# Patient Record
Sex: Female | Born: 2005 | Race: Black or African American | Hispanic: No | Marital: Single | Smoking: Never smoker
Health system: Southern US, Community
[De-identification: ages and names within clinical notes are randomized; demographics above are authoritative.]

## PROBLEM LIST (undated history)

## (undated) DIAGNOSIS — L309 Dermatitis, unspecified: Secondary | ICD-10-CM

## (undated) HISTORY — DX: Dermatitis, unspecified: L30.9

---
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
## 2023-03-18 ENCOUNTER — Ambulatory Visit (INDEPENDENT_AMBULATORY_CARE_PROVIDER_SITE_OTHER): Payer: MEDICAID | Admitting: Adult Health

## 2023-03-18 ENCOUNTER — Encounter: Payer: Self-pay | Admitting: Adult Health

## 2023-03-18 ENCOUNTER — Other Ambulatory Visit (HOSPITAL_COMMUNITY)
Admission: RE | Admit: 2023-03-18 | Discharge: 2023-03-18 | Disposition: A | Discharge status: Home / Self Care | Payer: MEDICAID | Source: Ambulatory Visit | Attending: Adult Health | Admitting: Adult Health

## 2023-03-18 VITALS — BP 142/92 | HR 101 | Ht 70.0 in | Wt 223.0 lb

## 2023-03-18 DIAGNOSIS — N898 Other specified noninflammatory disorders of vagina: Secondary | ICD-10-CM | POA: Diagnosis present

## 2023-03-18 DIAGNOSIS — N9489 Other specified conditions associated with female genital organs and menstrual cycle: Secondary | ICD-10-CM | POA: Diagnosis present

## 2023-03-18 DIAGNOSIS — R030 Elevated blood-pressure reading, without diagnosis of hypertension: Secondary | ICD-10-CM | POA: Insufficient documentation

## 2023-03-18 NOTE — Progress Notes (Signed)
  Subjective:     Patient ID: Robin Powers, female   DOB: 06-12-2005, 17 y.o.   MRN: [id number]  HPI Robin Powers is a 17 year old black female,single, G0P0, in complaining of vaginal irritation and burning, has used new soap called Yoni.   PCP is Lianne Moris PA  Review of Systems +vaginal irritation and burning, has used  new soap called Yoni.  She sweats a lot, she is athlete  Reviewed past medical,surgical, social and family history. Reviewed medications and allergies.     Objective:   Physical Exam BP (!) 142/92 (BP Location: Right Arm, Patient Position: Sitting, Cuff Size: Normal)   Pulse (!) 101   Ht 5\' 10"  (1.778 m)   Wt 223 lb (101.2 kg)   BMI 32.00 kg/m     Skin warm and dry.Pelvic: external genitalia is normal in appearance no lesions, inner labia red, vagina: scant white discharge without odor,tender at introitus,urethra has no lesions or masses noted, cervix:smooth and bulbous, uterus: normal size, shape and contour, non tender, no masses felt, adnexa: no masses or tenderness noted. Bladder is non tender and no masses felt.  She preformed self swab.  Fall risk is low  Upstream - 03/18/23 0919       Pregnancy Intention Screening   Does the patient want to become pregnant in the next year? No    Does the patient's partner want to become pregnant in the next year? No    Would the patient like to discuss contraceptive options today? No      Contraception Wrap Up   Current Method Oral Contraceptive    End Method Oral Contraceptive    Contraception Counseling Provided Yes            Examination chaperoned by Malachy Mood LPN  Assessment:     1. Vaginal burning +burning after using new soap CV swab sent - Cervicovaginal ancillary only( [place])  2. Vaginal irritation Inner labia red, no lesions CV swab sent for GC/CHL,trich,BV and yeast No soaps for now,  Pat dry when using bathroom Can apply thin layer of aquaphor to inner labia to help soothe  -  Cervicovaginal ancillary only( [place])  3. Elevated BP without diagnosis of hypertension I expect it was up due to being uncomfortable, usually good     Plan:     Follow up prn

## 2023-03-19 LAB — CERVICOVAGINAL ANCILLARY ONLY
Bacterial Vaginitis (gardnerella): NEGATIVE
Candida Glabrata: NEGATIVE
Candida Vaginitis: POSITIVE — AB
Chlamydia: NEGATIVE
Comment: NEGATIVE
Comment: NEGATIVE
Comment: NEGATIVE
Comment: NEGATIVE
Comment: NEGATIVE
Comment: NORMAL
Neisseria Gonorrhea: NEGATIVE
Trichomonas: NEGATIVE

## 2023-03-22 ENCOUNTER — Other Ambulatory Visit: Payer: Self-pay | Admitting: Adult Health

## 2023-03-22 MED ORDER — FLUCONAZOLE 150 MG PO TABS: ORAL_TABLET | ORAL | 1 refills | Status: AC

## 2023-04-08 ENCOUNTER — Ambulatory Visit
Admission: EM | Admit: 2023-04-08 | Discharge: 2023-04-08 | Disposition: A | Discharge status: Home / Self Care | Payer: MEDICAID | Attending: Nurse Practitioner | Admitting: Nurse Practitioner

## 2023-04-08 ENCOUNTER — Other Ambulatory Visit: Payer: Self-pay

## 2023-04-08 ENCOUNTER — Encounter: Payer: Self-pay | Admitting: Emergency Medicine

## 2023-04-08 DIAGNOSIS — J0100 Acute maxillary sinusitis, unspecified: Secondary | ICD-10-CM | POA: Diagnosis not present

## 2023-04-08 MED ORDER — AMOXICILLIN-POT CLAVULANATE 875-125 MG PO TABS: 1.0000 | ORAL_TABLET | Freq: Two times a day (BID) | ORAL | 0 refills | Status: AC

## 2023-04-08 MED ORDER — FLUTICASONE PROPIONATE 50 MCG/ACT NA SUSP: 2.0000 | Freq: Every day | NASAL | 0 refills | Status: AC

## 2023-04-08 MED ORDER — PSEUDOEPHEDRINE HCL 30 MG PO TABS: 30.0000 mg | ORAL_TABLET | ORAL | 0 refills | Status: AC | PRN

## 2023-04-08 NOTE — ED Provider Notes (Signed)
[facility]    CSN: [id number] Arrival date & time: 04/08/23  1358      History   Chief Complaint Chief Complaint  Patient presents with   Dizziness    HPI Robin Powers is a 17 y.o. female.   The history is provided by the patient.   Patient presents with a more than 1 week history of bilateral eye pressure, facial pressure, dizziness, neck pain, headache, nasal congestion, and blurry vision that started this morning.  Patient denies fever, chills, ear pain, ear drainage, cough, chest pain, abdominal pain, nausea, vomiting, diarrhea, or rash.  Patient states that she has also been sneezing quite a bit.  She states that she has had increasing nasal congestion and nasal drainage.  She reports that she has been taking over-the-counter ibuprofen for her symptoms.  Patient denies any obvious known sick contacts. 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 No pertinent surgical history.  OB History     Gravida  0   Para  0   Term  0   Preterm  0   AB  0   Living  0      SAB  0   IAB  0   Ectopic  0   Multiple  0   Live Births               Home Medications    Prior to Admission medications   Medication Sig Start Date End Date Taking? Authorizing Provider  amoxicillin-clavulanate  (AUGMENTIN) 875-125 MG tablet Take 1 tablet by mouth every 12 (twelve) hours. 04/08/23  Yes Leath-Warren, Sadie Haber, NP  fluconazole (DIFLUCAN) 150 MG tablet Take 1 now and 1 in 3 days 03/22/23   Adline Potter, NP  fluticasone [facility]) 50 MCG/ACT nasal spray Place 2 sprays into both nostrils daily. 04/08/23  Yes Leath-Warren, Sadie Haber, NP  pseudoephedrine (SUDAFED) 30 MG tablet Take 1 tablet (30 mg total) by mouth every 4 (four) hours as needed for congestion. 04/08/23  Yes Leath-Warren, Sadie Haber, NP  UNABLE TO FIND Vaginal probiotic-BID   Yes [provider]  Levonorgestrel-Ethinyl Estradiol (AMETHIA) 0.15-0.03 &0.01 MG tablet Take 1 tablet by mouth daily. 08/31/22   Adline Potter, NP    Family History Family History  Problem Relation Age of Onset   Crohn's disease Mother    Hypertension Maternal Grandfather    Hypertension Paternal Grandmother    Cancer Other     Social History Social History   Tobacco Use   Smoking status: Never    Passive exposure: Never   Smokeless tobacco: Never  Vaping Use   Vaping status: Former  Substance Use Topics   Alcohol use: No   Drug use: No     Allergies   Patient has no known allergies.   Review of Systems Review of Systems Per  HPI  Physical Exam Triage Vital Signs ED Triage Vitals  Encounter Vitals Group     BP 04/08/23 1718 136/83     Systolic BP Percentile --      Diastolic BP Percentile --      Pulse Rate 04/08/23 1718 92     Resp 04/08/23 1718 17     Temp 04/08/23 1718 98.4 F (36.9 C)     Temp Source 04/08/23 1718 Oral     SpO2 04/08/23 1718 97 %     Weight --      Height --      Head Circumference --      Peak Flow --      Pain Score 04/08/23 1729 6     Pain Loc --      Pain Education --      Exclude from Growth Chart --    No data found.  Updated Vital Signs BP 136/83 (BP Location: Right Arm)   Pulse 92   Temp 98.4 F (36.9 C) (Oral)   Resp 17   SpO2 97%   Visual Acuity Right  Eye Distance: 20/20 Left Eye Distance: 20/20 Bilateral Distance: 20/15 (denies any corrective lenses or glasses at baseline.)  Right Eye Near:   Left Eye Near:    Bilateral Near:     Physical Exam Vitals and nursing note reviewed.  Constitutional:      General: She is not in acute distress.    Appearance: Normal appearance.  HENT:     Head: Normocephalic.     Right Ear: Tympanic membrane, ear canal and external ear normal.     Left Ear: Tympanic membrane, ear canal and external ear normal.     Nose: Congestion present.     Right Turbinates: Enlarged and swollen.     Left Turbinates: Enlarged and swollen.     Right Sinus: Maxillary sinus tenderness present. No frontal sinus tenderness.     Left Sinus: Maxillary sinus tenderness present. No frontal sinus tenderness.     Mouth/Throat:     Lips: Pink.     Mouth: Mucous membranes are moist.     Pharynx: Postnasal drip present. No pharyngeal swelling, oropharyngeal exudate, posterior oropharyngeal erythema or uvula swelling.  Eyes:     Extraocular Movements: Extraocular movements intact.     Conjunctiva/sclera: Conjunctivae normal.     Pupils: Pupils are equal, round, and reactive to light.  Cardiovascular:     Rate and Rhythm: Normal rate and regular rhythm.     Pulses: Normal pulses.     Heart sounds: Normal heart sounds.  Pulmonary:     Effort: Pulmonary effort is normal. No respiratory distress.     Breath sounds: Normal breath sounds. No stridor. No wheezing, rhonchi or rales.  Abdominal:     General: Bowel sounds are normal.     Palpations: Abdomen is soft.     Tenderness: There is no abdominal tenderness.  Musculoskeletal:     Cervical back: Normal range of motion.  Lymphadenopathy:     Cervical: No cervical adenopathy.  Skin:    General: Skin is warm and dry.  Neurological:     General: No focal deficit present.     Mental Status: She is alert and oriented to person, place, and time.     GCS: GCS eye subscore is 4.  GCS verbal subscore is 5. GCS motor subscore is 6.     Cranial Nerves: Cranial nerves 2-12 are intact.     Sensory:  Sensation is intact.     Motor: Motor function is intact.     Coordination: Coordination is intact.     Gait: Gait is intact.  Psychiatric:        Mood and Affect: Mood normal.        Behavior: Behavior normal.      UC Treatments / Results  Labs (all labs ordered are listed, but only abnormal results are displayed) Labs Reviewed - No data to display  EKG   Radiology No results found.  Procedures Procedures (including critical care time)  Medications Ordered in UC Medications - No data to display  Initial Impression / Assessment and Plan / UC Course  I have reviewed the triage vital signs and the nursing notes.  Pertinent labs & imaging results that were available during my care of the patient were reviewed by me and considered in my medical decision making (see chart for details).  On exam, lung sounds are clear throughout, room air sats at 97%.  Patient with moderate maxillary sinus tenderness, she is also experienced dizziness and increased nasal congestion.  Symptoms consistent with acute maxillary sinusitis.  Will provide treatment with Augmentin 875/125 mg tablets, fluticasone 50 micro nasal spray, and Sudafed 30 mg.  Supportive care recommendations were provided and discussed with the patient to include fluids, rest, over-the-counter analgesics, and normal saline nasal spray.  Discussed indications with the patient regarding when follow-up be necessary.  Patient was in agreement with this plan of care and verbalized understanding.  All questions were answered.  Patient stable for discharge.  Final Clinical Impressions(s) / UC Diagnoses   Final diagnoses:  Acute maxillary sinusitis, recurrence not specified     Discharge Instructions      Take medication as directed. Increase fluids and get plenty of rest. May take over-the-counter Ibuprofen or  Tylenol as needed for pain, fever, or general discomfort. Recommend normal saline nasal spray to help with nasal congestion throughout the day. For your cough, it may be helpful to use a humidifier at bedtime during sleep. If symptoms fail to improve with this treatment, you may follow-up in this clinic or with your primary care physician.     ED Prescriptions     Medication Sig Dispense Auth. Provider   amoxicillin-clavulanate (AUGMENTIN) 875-125 MG tablet Take 1 tablet by mouth every 12 (twelve) hours. 14 tablet Leath-Warren, Sadie Haber, NP   fluticasone (FLONASE) 50 MCG/ACT nasal spray Place 2 sprays into both nostrils daily. 16 g Leath-Warren, Sadie Haber, NP   pseudoephedrine (SUDAFED) 30 MG tablet Take 1 tablet (30 mg total) by mouth every 4 (four) hours as needed for congestion. 30 tablet Leath-Warren, Sadie Haber, NP      PDMP not reviewed this encounter.   Abran Cantor, NP 04/08/23 1756

## 2023-04-08 NOTE — ED Triage Notes (Addendum)
Pt reports bilateral eye pressure, facial pain, dizziness, neck pain, headache, leaning to one side with ambulation x 1 week. Blurry vision since this morning.  Denies any extremity weakness or numbness. Speech clear. Airway patent. Equal grips.

## 2023-04-08 NOTE — Discharge Instructions (Signed)
Take medication as directed. Increase fluids and get plenty of rest. May take over-the-counter Ibuprofen or Tylenol as needed for pain, fever, or general discomfort. Recommend normal saline nasal spray to help with nasal congestion throughout the day. For your cough, it may be helpful to use a humidifier at bedtime during sleep. If symptoms fail to improve with this treatment, you may follow-up in this clinic or with your primary care physician.

## 2023-05-26 NOTE — ED Triage Notes (Signed)
 Patient states she was seen at Dr yesterday - was diagnosed with stomach virus, reports waking this morning with left sided chest pain described as sharp, intermittent, made worse with deep breathing and moving, non radiating

## 2023-05-26 NOTE — ED Provider Notes (Signed)
 No chief complaint on file.      HPI HPI 18 year old female presents to the emergency department today for evaluation of chest pain.  Patient reports the pain is worse with palpation, deep inspiration and movement.  She reports some shortness of breath.  Patient reports that the past several days she been having a GI illness.  Exposed to influenza.  [facility]ent to [facility] had negative COVID, flu test.  Patient reports the chest pain developed today.  Patient denies any leg swelling.  She is on oral contraceptives.  No history of PE/DVT.  No cardiac history.  No family history of cardiac disease.  She is taken no medications for symptoms prior to arrival.  Denies any further nausea and vomiting today.    Patient History History reviewed. No pertinent past medical history. History reviewed. No pertinent surgical history. No family history on file. Social History   Tobacco Use  . Smoking status: Never  . Smokeless tobacco: Never  Substance Use Topics  . Alcohol use: Not Currently  . Drug use: Not Currently      Review of Systems Review of Systems Please refer to the HPI    Physical Exam ED Triage Vitals  Temp 05/26/23 1901 98.7 F (37.1 C)  Heart Rate 05/26/23 1858 109  Resp 05/26/23 1858 17  BP 05/26/23 1858 (!) 139/94  MAP (mmHg) 05/26/23 1858 106  SpO2 05/26/23 1858 100 %  O2 Device 05/26/23 1858 None (Room air)  O2 Flow Rate (L/min) --   Weight 05/26/23 1901 97.5 kg (215 lb)   Physical Exam Constitutional:      General: She is not in acute distress.    Appearance: Normal appearance. She is normal weight. She is not ill-appearing or toxic-appearing.  HENT:     Head: Normocephalic and atraumatic.     Mouth/Throat:     Mouth: Mucous membranes are moist.  Eyes:     Conjunctiva/sclera: Conjunctivae normal.  Cardiovascular:     Rate and Rhythm: Normal rate and regular rhythm.     Pulses: Normal pulses.     Heart sounds: Normal  heart sounds.  Pulmonary:     Effort: Pulmonary effort is normal.     Breath sounds: Normal breath sounds.  Chest:     Chest wall: Tenderness present.  Abdominal:     General: Abdomen is flat. Bowel sounds are normal.     Palpations: Abdomen is soft.     Tenderness: There is no abdominal tenderness.  Musculoskeletal:        General: Normal range of motion.     Cervical back: Normal range of motion and neck supple.  Skin:    General: Skin is warm and dry.     Capillary Refill: Capillary refill takes less than 2 seconds.  Neurological:     Mental Status: She is alert.  Psychiatric:        Mood and Affect: Mood normal.        Behavior: Behavior normal.        CHA2DS2-VASc Score: N/A  Glasgow Coma Scale Score: 15                   Procedures                       ED Course & MDM   Medical Decision Making 18 year old presents for chest pain.  Clinical Complexity Patient's presentation  is most consistent with acute presentation with potential threat to life or bodily function.   DDx: ACS, PE, dehydration, electrolyte derangement, cardiac arrhythmia, myocarditis, pericarditis, endocarditis, pleurisy, costochondritis, COVID-19, influenza  The patient was maintained on a cardiac monitor during ER stay.  I personally viewed and interpreted the cardiac monitoring which showed an underlying rhythm of: Sinus tachycardia heart rate of 102 bpm  EKG personally reviewed by myself which shows normal sinus rhythm with a heart rate of 98 bpm with a normal PR, QRS and QT interval.  No signs of acute ST elevation or depression.  No concerning arrhythmias.  Labs and imaging were performed which independently reviewed and interpreted shows no leukocytosis.  No significant electrolyte derangement.  Mild hypokalemia which was replaced with oral potassium in the ED.  Troponin was 3.  COVID and flu test were negative.  Pregnancy test was negative.  D-dimer was elevated.  Subsequent PE study was  performed which independently reviewed and interpreted shows no evidence of acute PE and no other acute intrathoracic abnormality.  Patient symptoms could be secondary to costochondritis, pleurisy, GERD.  I have low sufficient for cardiac etiology such as ACS of patient's pain.  Will treat symptomatically in the outpatient setting.  Pt is hemodynamically stable, in NAD, & able to ambulate in the ED. Evaluation does not show pathology that would require ongoing emergent intervention or inpatient treatment. I explained the diagnosis to the patient. Pain has been managed & Pt has no complaints prior to dc. Pt is comfortable with above plan and is stable for discharge at this time. All questions were answered prior to disposition. Strict return precautions for f/u to the ED were discussed. Encouraged follow up with PCP.   Problems Addressed: Chest pain, unspecified type: complicated acute illness or injury  Amount and/or Complexity of Data Reviewed Labs: ordered. Radiology: ordered. ECG/medicine tests: ordered.  Risk Prescription drug management.      ED Disposition:  Discharge Final diagnoses:  Chest pain, unspecified type    ED Prescriptions   None

## 2023-10-07 ENCOUNTER — Other Ambulatory Visit: Payer: Self-pay | Admitting: Adult Health

## 2023-10-21 ENCOUNTER — Emergency Department (HOSPITAL_COMMUNITY)
Admission: EM | Admit: 2023-10-21 | Discharge: 2023-10-21 | Discharge status: Against Medical Advice | Payer: MEDICAID | Source: Ambulatory Visit | Attending: Emergency Medicine | Admitting: Emergency Medicine

## 2023-10-21 ENCOUNTER — Other Ambulatory Visit: Payer: Self-pay

## 2023-10-21 ENCOUNTER — Encounter (HOSPITAL_COMMUNITY): Payer: Self-pay

## 2023-10-21 DIAGNOSIS — M7989 Other specified soft tissue disorders: Secondary | ICD-10-CM | POA: Diagnosis present

## 2023-10-21 DIAGNOSIS — Z5321 Procedure and treatment not carried out due to patient leaving prior to being seen by health care provider: Secondary | ICD-10-CM | POA: Insufficient documentation

## 2023-10-21 LAB — COMPREHENSIVE METABOLIC PANEL WITH GFR
ALT: 12 U/L (ref 0–44)
AST: 19 U/L (ref 15–41)
Albumin: 3.3 g/dL — ABNORMAL LOW (ref 3.5–5.0)
Alkaline Phosphatase: 45 U/L (ref 38–126)
Anion gap: 10 (ref 5–15)
BUN: 10 mg/dL (ref 6–20)
CO2: 25 mmol/L (ref 22–32)
Calcium: 8.9 mg/dL (ref 8.9–10.3)
Chloride: 102 mmol/L (ref 98–111)
Creatinine, Ser: 0.79 mg/dL (ref 0.44–1.00)
GFR, Estimated: >60 mL/min (ref >60)
Glucose, Bld: 81 mg/dL (ref 70–99)
Potassium: 3.7 mmol/L (ref 3.5–5.1)
Sodium: 137 mmol/L (ref 135–145)
Total Bilirubin: 0.5 mg/dL (ref 0.0–1.2)
Total Protein: 7 g/dL (ref 6.5–8.1)

## 2023-10-21 LAB — CBC WITH DIFFERENTIAL/PLATELET
Abs Immature Granulocytes: 0.02 K/uL (ref 0.00–0.07)
Basophils Absolute: 0 K/uL (ref 0.0–0.1)
Basophils Relative: 0 %
Eosinophils Absolute: 0.3 K/uL (ref 0.0–0.5)
Eosinophils Relative: 3 %
HCT: 38.9 % (ref 36.0–46.0)
Hemoglobin: 12.4 g/dL (ref 12.0–15.0)
Immature Granulocytes: 0 %
Lymphocytes Relative: 27 %
Lymphs Abs: 2 K/uL (ref 0.7–4.0)
MCH: 27 pg (ref 26.0–34.0)
MCHC: 31.9 g/dL (ref 30.0–36.0)
MCV: 84.7 fL (ref 80.0–100.0)
Monocytes Absolute: 0.6 K/uL (ref 0.1–1.0)
Monocytes Relative: 8 %
Neutro Abs: 4.7 K/uL (ref 1.7–7.7)
Neutrophils Relative %: 62 %
Platelets: 386 K/uL (ref 150–400)
RBC: 4.59 MIL/uL (ref 3.87–5.11)
RDW: 12.5 % (ref 11.5–15.5)
WBC: 7.6 K/uL (ref 4.0–10.5)
nRBC: 0 % (ref 0.0–0.2)

## 2023-10-21 LAB — BRAIN NATRIURETIC PEPTIDE: B Natriuretic Peptide: 9 pg/mL (ref 0.0–100.0)

## 2023-10-21 NOTE — ED Triage Notes (Signed)
 Pt arrived via POV c/o bilateral lower extremity swelling that has occurring over past month. Pt unsure if she has a blood clot, may have heart failure and Pt reports recently starting her job and is on her feet a lot. Pt also concerned it may be related to her birth control.  Pt reports pain with ambulation.

## 2023-10-22 ENCOUNTER — Ambulatory Visit
Admission: EM | Admit: 2023-10-22 | Discharge: 2023-10-22 | Disposition: A | Discharge status: Home / Self Care | Payer: MEDICAID

## 2023-10-22 DIAGNOSIS — R600 Localized edema: Secondary | ICD-10-CM | POA: Diagnosis not present

## 2023-10-22 NOTE — ED Triage Notes (Signed)
 Pt states that she has some bilateral feet swelling and pain. X1 month

## 2023-10-22 NOTE — ED Provider Notes (Signed)
 [facility]    CSN: [id number] Arrival date & time: 10/22/23  0951      History   Chief Complaint Chief Complaint  Patient presents with   feet swelling    HPI Robin Powers is a 18 y.o. female.   Patient presenting today with a month or so of bilateral lower leg and foot edema.  States she came home from college and started working at [employer] standing throughout the day and notes that about the time when the swelling began.  She denies any associated point tenderness, redness, unilateral worsening of the swelling, chest pain, shortness of breath, palpitations, fever, chills.  Sought care several weeks ago at a different urgent care and was given 3 days of Lasix which temporarily helped but swelling returned.  She went back yesterday and was told her symptoms are very concerning and to go to the emergency department.  She went to the emergency department and waited for 7 hours, had triage labs pulled but did not stay to see a provider given the long wait times.  Following up today for further evaluation.  She states urgent care yesterday did send in several days of Lasix but she has not yet picked this up.  Denies past medical history of cardiopulmonary issues, changes to medications or supplements.    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 No pertinent surgical history.  OB History     Gravida  0   Para  0   Term  0   Preterm  0   AB  0   Living  0      SAB  0   IAB  0   Ectopic  0   Multiple  0   Live Births               Home Medications    Prior to Admission medications   Medication Sig Start Date End Date Taking? Authorizing Provider  ASHLYNA 0.15-0.03 &0.01 MG tablet Take 1 tablet by mouth once daily 10/07/23  Yes Signa Nest A, NP  fluconazole  (DIFLUCAN ) 150 MG tablet Take 1 now and 1 in 3 days 03/22/23   Signa Nest LABOR, NP  furosemide (LASIX) 40 MG tablet Take 40 mg by mouth daily. 10/21/23  Yes [provider]  amoxicillin -clavulanate (AUGMENTIN ) 875-125 MG tablet Take 1 tablet by mouth every 12 (twelve) hours. 04/08/23   Leath-Warren, Etta PARAS, NP  fluticasone  (FLONASE ) 50 MCG/ACT nasal spray Place 2 sprays into both nostrils daily. 04/08/23   Leath-Warren, Etta PARAS, NP  pseudoephedrine  (SUDAFED) 30 MG tablet Take 1 tablet (30 mg total) by mouth every 4 (four) hours as needed for congestion. 04/08/23   Leath-Warren, Etta PARAS, NP  UNABLE TO FIND Vaginal probiotic-BID    [provider]    Family History Family History  Problem Relation  Age of Onset   Crohn's disease Mother    Hypertension Maternal Grandfather    Hypertension Paternal Grandmother    Cancer Other     Social History Social History   Tobacco Use   Smoking status: Never    Passive exposure: Never   Smokeless tobacco: Never  Vaping Use   Vaping status: Former  Substance Use Topics   Alcohol use: No   Drug use: No     Allergies   Orange oil   Review of Systems Review of Systems Per HPI  Physical Exam Triage Vital Signs ED Triage Vitals  Encounter Vitals Group     BP 10/22/23 1008 118/79     Girls Systolic BP Percentile --      Girls Diastolic BP Percentile --      Boys Systolic BP Percentile --      Boys Diastolic BP  Percentile --      Pulse Rate 10/22/23 1008 89     Resp 10/22/23 1008 20     Temp 10/22/23 1008 98.9 F (37.2 C)     Temp Source 10/22/23 1008 Oral     SpO2 10/22/23 1008 96 %     Weight 10/22/23 1005 228 lb (103.4 kg)     Height 10/22/23 1005 5' 10 (1.778 m)     Head Circumference --      Peak Flow --      Pain Score 10/22/23 1004 9     Pain Loc --      Pain Education --      Exclude from Growth Chart --    No data found.  Updated Vital Signs BP 118/79 (BP Location: Right Arm)   Pulse 89   Temp 98.9 F (37.2 C) (Oral)   Resp 20   Ht 5' 10 (1.778 m)   Wt 228 lb (103.4 kg)   LMP  (LMP Unknown)   SpO2 96%   BMI 32.71 kg/m   Visual Acuity Right Eye Distance:   Left Eye Distance:   Bilateral Distance:    Right Eye Near:   Left Eye Near:    Bilateral Near:     Physical Exam Vitals and nursing note reviewed.  Constitutional:      Appearance: Normal appearance. She is not ill-appearing.  HENT:     Head: Atraumatic.     Mouth/Throat:     Mouth: Mucous membranes are moist.  Eyes:     Extraocular Movements: Extraocular movements intact.     Conjunctiva/sclera: Conjunctivae normal.  Cardiovascular:     Rate and Rhythm: Normal rate and regular rhythm.     Heart sounds: Normal heart sounds.  Pulmonary:     Effort: Pulmonary effort is normal. No respiratory distress.     Breath sounds: Normal breath sounds.  Musculoskeletal:        General: Swelling present. Normal range of motion.     Cervical back: Normal range of motion and neck supple.     Comments: 1+ edema symmetric to bilateral lower legs and foot/ankles.  Nonpitting.  No point tenderness, palpable cords, negative squeeze test, negative Homans' sign bilaterally  Skin:    General: Skin is warm and dry.  Neurological:     Mental Status: She is alert and oriented to person, place, and time.     Motor: No weakness.     Gait: Gait normal.     Comments: Bilateral lower extremities neurovascularly intact   Psychiatric:        Mood  and Affect: Mood normal.        Thought Content: Thought content normal.        Judgment: Judgment normal.      UC Treatments / Results  Labs (all labs ordered are listed, but only abnormal results are displayed) Labs Reviewed - No data to display  EKG   Radiology No results found.  Procedures Procedures (including critical care time)  Medications Ordered in UC Medications - No data to display  Initial Impression / Assessment and Plan / UC Course  I have reviewed the triage vital signs and the nursing notes.  Pertinent labs & imaging results that were available during my care of the patient were reviewed by me and considered in my medical decision making (see chart for details).     Labs drawn by ER personally reviewed and benign from last night, vital signs within normal limits, exam very reassuring today with no red flag findings.  Very low suspicion for more emergent cause of ongoing edema, suspect can be improved with lifestyle changes to include maintaining a healthy weight, exercise, reducing sodium and processed foods, increasing water and electrolyte intake, compression stockings, leg elevation.  Follow-up with primary care for recheck if worsening or not resolving.  ED return precautions reviewed.  Final Clinical Impressions(s) / UC Diagnoses   Final diagnoses:  Peripheral edema     Discharge Instructions      As we discussed, reduce your salt intake particularly processed or frozen packaged foods, increase your water and electrolyte intake, exercise regularly and maintain a healthy weight, wear compression stockings that go up to the knee throughout the day until bedtime, elevate your legs at rest.  Follow-up for worsening or unresolving symptoms.    ED Prescriptions   None    PDMP not reviewed this encounter.   Stuart Vernell Norris, [place] 10/22/23 1100

## 2023-10-22 NOTE — Discharge Instructions (Signed)
 As we discussed, reduce your salt intake particularly processed or frozen packaged foods, increase your water and electrolyte intake, exercise regularly and maintain a healthy weight, wear compression stockings that go up to the knee throughout the day until bedtime, elevate your legs at rest.  Follow-up for worsening or unresolving symptoms.

## 2023-12-21 ENCOUNTER — Other Ambulatory Visit: Payer: Self-pay | Admitting: Adult Health

## 2023-12-31 ENCOUNTER — Encounter: Payer: Self-pay | Admitting: *Deleted

## 2024-02-04 ENCOUNTER — Encounter: Payer: Self-pay | Admitting: Emergency Medicine

## 2024-02-04 ENCOUNTER — Ambulatory Visit
Admission: EM | Admit: 2024-02-04 | Discharge: 2024-02-04 | Disposition: A | Discharge status: Home / Self Care | Payer: MEDICAID | Attending: Nurse Practitioner | Admitting: Nurse Practitioner

## 2024-02-04 ENCOUNTER — Other Ambulatory Visit: Payer: Self-pay

## 2024-02-04 DIAGNOSIS — N939 Abnormal uterine and vaginal bleeding, unspecified: Secondary | ICD-10-CM | POA: Insufficient documentation

## 2024-02-04 DIAGNOSIS — N898 Other specified noninflammatory disorders of vagina: Secondary | ICD-10-CM | POA: Diagnosis present

## 2024-02-04 LAB — POCT URINE PREGNANCY: Preg Test, Ur: NEGATIVE

## 2024-02-04 NOTE — ED Provider Notes (Signed)
 [facility]    CSN: [id number] Arrival date & time: 02/04/24  1414      History   Chief Complaint Chief Complaint  Patient presents with   Vaginal Discharge    HPI Robin Powers is a 18 y.o. female.   The history is provided by the patient.   Patient presents with a 1 week history of vaginal discharge.  Patient states that she has noticed intermittent clots over the past 24 hours.  She states that has since improved.  She denies fever, chills, abdominal pain, pelvic pain, urinary frequency, urgency, hematuria, flank pain, low back pain, vaginal itching, vaginal odor, or vaginal irritation.  Patient reports that she has had 1 female partner in the past 90 days, states her last period was 4 to 5 years ago.  States that she is taking oral birth control for a history of dysmenorrhea.  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 No pertinent surgical history.  OB History     Gravida  0   Para  0   Term  0   Preterm  0   AB  0   Living  0      SAB  0   IAB  0   Ectopic  0   Multiple  0   Live Births               Home Medications    Prior to Admission medications   Medication Sig Start Date End Date Taking? Authorizing Provider  fluconazole  (DIFLUCAN ) 150 MG tablet Take 1 now and  1 in 3 days 03/22/23   Signa Delon LABOR, NP  amoxicillin -clavulanate (AUGMENTIN ) 875-125 MG tablet Take 1 tablet by mouth every 12 (twelve) hours. 04/08/23   Leath-Warren, Etta PARAS, NP  ASHLYNA 0.15-0.03 &0.01 MG tablet Take 1 tablet by mouth once daily 12/21/23   Griffin, Jennifer A, NP  fluticasone  (FLONASE ) 50 MCG/ACT nasal spray Place 2 sprays into both nostrils daily. 04/08/23   Leath-Warren, Etta PARAS, NP  furosemide (LASIX) 40 MG tablet Take 40 mg by mouth daily. 10/21/23   [provider]  pseudoephedrine  (SUDAFED) 30 MG tablet Take 1 tablet (30 mg total) by mouth every 4 (four) hours as needed for congestion. 04/08/23   Leath-Warren, Etta PARAS, NP  UNABLE TO FIND Vaginal probiotic-BID    [provider]    Family History Family History  Problem Relation Age of Onset   Crohn's disease Mother    Hypertension Maternal Grandfather    Hypertension Paternal Grandmother    Cancer Other     Social History Social History   Tobacco Use   Smoking status: Never    Passive exposure: Never   Smokeless tobacco: Never  Vaping Use   Vaping status: Former  Substance Use Topics   Alcohol use: No   Drug use: No  Allergies   Orange oil   Review of Systems Review of Systems Per HPI  Physical Exam Triage Vital Signs ED Triage Vitals  Encounter Vitals Group     BP 02/04/24 1422 125/84     Girls Systolic BP Percentile --      Girls Diastolic BP Percentile --      Boys Systolic BP Percentile --      Boys Diastolic BP Percentile --      Pulse Rate 02/04/24 1422 94     Resp 02/04/24 1422 20     Temp 02/04/24 1422 97.8 F (36.6 C)     Temp Source 02/04/24 1422 Oral     SpO2 02/04/24 1422 97 %     Weight --      Height --      Head Circumference --      Peak Flow --      Pain Score 02/04/24 1429 2     Pain Loc --      Pain Education --      Exclude from Growth Chart --    No data found.  Updated Vital Signs BP 125/84 (BP Location: Right Arm)    Pulse 94   Temp 97.8 F (36.6 C) (Oral)   Resp 20   SpO2 97%   Visual Acuity Right Eye Distance:   Left Eye Distance:   Bilateral Distance:    Right Eye Near:   Left Eye Near:    Bilateral Near:     Physical Exam Vitals and nursing note reviewed.  Constitutional:      General: She is not in acute distress.    Appearance: Normal appearance.  HENT:     Head: Normocephalic.  Eyes:     Extraocular Movements: Extraocular movements intact.     Pupils: Pupils are equal, round, and reactive to light.  Cardiovascular:     Rate and Rhythm: Normal rate and regular rhythm.     Pulses: Normal pulses.     Heart sounds: Normal heart sounds.  Pulmonary:     Effort: Pulmonary effort is normal. No respiratory distress.     Breath sounds: Normal breath sounds. No stridor. No wheezing, rhonchi or rales.  Abdominal:     General: Bowel sounds are normal.     Palpations: Abdomen is soft.     Tenderness: There is no abdominal tenderness.  Genitourinary:    Comments: GU exam deferred, self swab performed  Musculoskeletal:     Cervical back: Normal range of motion.  Neurological:     General: No focal deficit present.     Mental Status: She is alert and oriented to person, place, and time.  Psychiatric:        Mood and Affect: Mood normal.        Behavior: Behavior normal.      UC Treatments / Results  Labs (all labs ordered are listed, but only abnormal results are displayed) Labs Reviewed - No data to display  EKG   Radiology No results found.  Procedures Procedures (including critical care time)  Medications Ordered in UC Medications - No data to display  Initial Impression / Assessment and Plan / UC Course  I have reviewed the triage vital signs and the nursing notes.  Pertinent labs & imaging results that were available during my care of the patient were reviewed by me and considered in my medical decision making (see chart for details).  Urine pregnancy test is  negative, cytology swab is pending.  Patient is requesting testing for all STIs and BV and yeast.  Patient advised to continue her current birth control.  Recommended to patient that she should follow-up with gynecology for reevaluation if the vaginal bleeding continues.  Patient was also given strict ER follow-up precautions regarding the vaginal bleeding.  Supportive care recommendations were provided and discussed with the patient to include refraining from sexual intercourse until her test results are received, notifying all partners if her test results are positive, and refraining from sexual intercourse for an additional 7 days if treatment is required.  Patient was in agreement with this plan of care and verbalizes understanding.  All questions were answered.  Patient stable for discharge.   Final Clinical Impressions(s) / UC Diagnoses   Final diagnoses:  None   Discharge Instructions   None    ED Prescriptions   None    PDMP not reviewed this encounter.   Gilmer Etta PARAS, NP 02/04/24 1547

## 2024-02-04 NOTE — ED Triage Notes (Addendum)
 Pt reports brown vaginal discharge with intermittent clots since last night. Pt denies abdominal pain reports headache's daily x1 week. Has taken ibuprofen . Denies vaginal bleeding at this time. Reports last intercourse on Monday. Last period 4-5 years ago. Taking oral BC.   Vaginal self swab obtained post triage.

## 2024-02-04 NOTE — Discharge Instructions (Addendum)
 The urine pregnancy test was negative.  Cytology swab is pending.  You will be contacted if the pending test results are abnormal.  You will also have access to the results via MyChart. Refrain from sexual intercourse until your test results are received. If your test results are positive, you will need to notify all partners.  If treatment is required, you will need to refrain from sexual intercourse for an additional 7 days after completing treatment. If you begin to experience heavier vaginal bleeding to include saturating more than 1 pad an hour, recommend follow-up in the emergency department for further evaluation. Please follow-up with your gynecologist for reevaluation if your test results are negative and you are continuing to experience symptoms. Follow-up as needed.

## 2024-02-07 LAB — CERVICOVAGINAL ANCILLARY ONLY
Bacterial Vaginitis (gardnerella): POSITIVE — AB
Candida Glabrata: NEGATIVE
Candida Vaginitis: POSITIVE — AB
Chlamydia: NEGATIVE
Comment: NEGATIVE
Comment: NEGATIVE
Comment: NEGATIVE
Comment: NEGATIVE
Comment: NEGATIVE
Comment: NORMAL
Neisseria Gonorrhea: NEGATIVE
Trichomonas: NEGATIVE

## 2024-02-08 ENCOUNTER — Ambulatory Visit (HOSPITAL_COMMUNITY): Payer: Self-pay

## 2024-02-08 MED ORDER — FLUCONAZOLE 150 MG PO TABS: 150.0000 mg | ORAL_TABLET | Freq: Once | ORAL | 0 refills | Status: AC

## 2024-02-08 MED ORDER — METRONIDAZOLE 500 MG PO TABS: 500.0000 mg | ORAL_TABLET | Freq: Two times a day (BID) | ORAL | 0 refills | Status: AC

## 2024-03-20 ENCOUNTER — Telehealth: Payer: Self-pay | Admitting: Adult Health

## 2024-03-20 ENCOUNTER — Other Ambulatory Visit: Payer: Self-pay | Admitting: Adult Health

## 2024-03-20 NOTE — Telephone Encounter (Signed)
 Pt called asking for a refill on Robin Powers. Pt advised it looks like [facility] refilled that today and to call pharmacy and ask when they will have it ready. Pt voiced understanding. JSY

## 2024-03-20 NOTE — Telephone Encounter (Signed)
 Patient is requesting refill on ASHLYNA. Please advise.

## 2024-04-25 ENCOUNTER — Ambulatory Visit: Payer: MEDICAID | Admitting: Adult Health

## 2024-04-28 ENCOUNTER — Ambulatory Visit: Payer: MEDICAID | Admitting: Adult Health

## 2024-05-02 ENCOUNTER — Encounter (HOSPITAL_COMMUNITY): Payer: Self-pay

## 2024-05-02 ENCOUNTER — Emergency Department (HOSPITAL_COMMUNITY): Payer: MEDICAID

## 2024-05-02 ENCOUNTER — Emergency Department (HOSPITAL_COMMUNITY)
Admission: EM | Admit: 2024-05-02 | Discharge: 2024-05-02 | Disposition: A | Discharge status: Home / Self Care | Payer: MEDICAID | Attending: Emergency Medicine | Admitting: Emergency Medicine

## 2024-05-02 ENCOUNTER — Other Ambulatory Visit: Payer: Self-pay

## 2024-05-02 DIAGNOSIS — R1024 Suprapubic pain: Secondary | ICD-10-CM | POA: Diagnosis not present

## 2024-05-02 DIAGNOSIS — R1032 Left lower quadrant pain: Secondary | ICD-10-CM | POA: Insufficient documentation

## 2024-05-02 DIAGNOSIS — N390 Urinary tract infection, site not specified: Secondary | ICD-10-CM

## 2024-05-02 DIAGNOSIS — R112 Nausea with vomiting, unspecified: Secondary | ICD-10-CM | POA: Diagnosis not present

## 2024-05-02 DIAGNOSIS — R109 Unspecified abdominal pain: Secondary | ICD-10-CM

## 2024-05-02 DIAGNOSIS — R1031 Right lower quadrant pain: Secondary | ICD-10-CM | POA: Insufficient documentation

## 2024-05-02 DIAGNOSIS — R1030 Lower abdominal pain, unspecified: Secondary | ICD-10-CM | POA: Diagnosis present

## 2024-05-02 LAB — URINALYSIS, ROUTINE W REFLEX MICROSCOPIC
Bilirubin Urine: NEGATIVE
Glucose, UA: NEGATIVE mg/dL
Ketones, ur: NEGATIVE mg/dL
Nitrite: NEGATIVE
Protein, ur: 30 mg/dL — AB
Specific Gravity, Urine: 1.018 (ref 1.005–1.030)
WBC, UA: >50 WBC/hpf (ref 0–5)
pH: 6 (ref 5.0–8.0)

## 2024-05-02 LAB — CBC WITH DIFFERENTIAL/PLATELET
Abs Immature Granulocytes: 0.02 K/uL (ref 0.00–0.07)
Basophils Absolute: 0.1 K/uL (ref 0.0–0.1)
Basophils Relative: 1 %
Eosinophils Absolute: 0.1 K/uL (ref 0.0–0.5)
Eosinophils Relative: 2 %
HCT: 43 % (ref 36.0–46.0)
Hemoglobin: 13.4 g/dL (ref 12.0–15.0)
Immature Granulocytes: 0 %
Lymphocytes Relative: 22 %
Lymphs Abs: 1.9 K/uL (ref 0.7–4.0)
MCH: 26.2 pg (ref 26.0–34.0)
MCHC: 31.2 g/dL (ref 30.0–36.0)
MCV: 84.1 fL (ref 80.0–100.0)
Monocytes Absolute: 0.7 K/uL (ref 0.1–1.0)
Monocytes Relative: 8 %
Neutro Abs: 5.8 K/uL (ref 1.7–7.7)
Neutrophils Relative %: 67 %
Platelets: 414 K/uL — ABNORMAL HIGH (ref 150–400)
RBC: 5.11 MIL/uL (ref 3.87–5.11)
RDW: 12 % (ref 11.5–15.5)
WBC: 8.7 K/uL (ref 4.0–10.5)
nRBC: 0 % (ref 0.0–0.2)

## 2024-05-02 LAB — LIPASE, BLOOD: Lipase: 20 U/L (ref 11–51)

## 2024-05-02 LAB — COMPREHENSIVE METABOLIC PANEL WITH GFR
ALT: 14 U/L (ref 0–44)
AST: 19 U/L (ref 15–41)
Albumin: 4.4 g/dL (ref 3.5–5.0)
Alkaline Phosphatase: 56 U/L (ref 38–126)
Anion gap: 15 (ref 5–15)
BUN: 9 mg/dL (ref 6–20)
CO2: 22 mmol/L (ref 22–32)
Calcium: 9.4 mg/dL (ref 8.9–10.3)
Chloride: 103 mmol/L (ref 98–111)
Creatinine, Ser: 0.9 mg/dL (ref 0.44–1.00)
GFR, Estimated: >60 mL/min
Glucose, Bld: 80 mg/dL (ref 70–99)
Potassium: 3.5 mmol/L (ref 3.5–5.1)
Sodium: 140 mmol/L (ref 135–145)
Total Bilirubin: 0.4 mg/dL (ref 0.0–1.2)
Total Protein: 8 g/dL (ref 6.5–8.1)

## 2024-05-02 LAB — HCG, SERUM, QUALITATIVE: Preg, Serum: NEGATIVE

## 2024-05-02 MED ORDER — SODIUM CHLORIDE 0.9 % IV SOLN
1.0000 g | Freq: Once | INTRAVENOUS | Status: AC
  Administered 2024-05-02: 1 g via INTRAVENOUS
  Filled 2024-05-02: qty 10

## 2024-05-02 MED ORDER — SODIUM CHLORIDE 0.9 % IV BOLUS
1000.0000 mL | Freq: Once | INTRAVENOUS | Status: AC
  Administered 2024-05-02: 1000 mL via INTRAVENOUS

## 2024-05-02 MED ORDER — DIPHENHYDRAMINE HCL 50 MG/ML IJ SOLN
25.0000 mg | Freq: Once | INTRAMUSCULAR | Status: AC
  Administered 2024-05-02: 25 mg via INTRAVENOUS
  Filled 2024-05-02: qty 1

## 2024-05-02 MED ORDER — METOCLOPRAMIDE HCL 10 MG PO TABS: 10.0000 mg | ORAL_TABLET | Freq: Four times a day (QID) | ORAL | 0 refills | Status: AC | PRN

## 2024-05-02 MED ORDER — DICYCLOMINE HCL 20 MG PO TABS: 20.0000 mg | ORAL_TABLET | Freq: Two times a day (BID) | ORAL | 0 refills | Status: AC

## 2024-05-02 MED ORDER — IOHEXOL 300 MG/ML  SOLN
100.0000 mL | Freq: Once | INTRAMUSCULAR | Status: AC | PRN
  Administered 2024-05-02: 100 mL via INTRAVENOUS

## 2024-05-02 MED ORDER — METOCLOPRAMIDE HCL 5 MG/ML IJ SOLN
10.0000 mg | Freq: Once | INTRAMUSCULAR | Status: AC
  Administered 2024-05-02: 10 mg via INTRAVENOUS
  Filled 2024-05-02: qty 2

## 2024-05-02 MED ORDER — CEPHALEXIN 500 MG PO CAPS: 500.0000 mg | ORAL_CAPSULE | Freq: Three times a day (TID) | ORAL | 0 refills | Status: AC

## 2024-05-02 MED ORDER — DICYCLOMINE HCL 10 MG/ML IM SOLN
20.0000 mg | Freq: Once | INTRAMUSCULAR | Status: AC
  Administered 2024-05-02: 20 mg via INTRAMUSCULAR
  Filled 2024-05-02: qty 2

## 2024-05-02 NOTE — Discharge Instructions (Signed)
 Please take all antibiotics as directed.  Follow-up closely with your primary care doctor on an outpatient basis.  Return to emergency department immediately for any new or worsening symptoms.

## 2024-05-02 NOTE — ED Triage Notes (Signed)
 Pt arrived via POV c/o abdominal pain that began Friday evening after eating fast food. Pt reports N/V began shortly after and reports going to urgent care w/o relief. Pt reports having 2 negative pregnancy results as well.

## 2024-05-02 NOTE — ED Provider Notes (Signed)
 " [place] EMERGENCY DEPARTMENT AT [facility] Provider Note   CSN: [id number] Arrival date & time: 05/02/24  1603     Patient presents with: Abdominal Pain   Robin Powers is a 19 y.o. female.   Patient is a 19 year old female who presents to Emergency Department with chief complaint of nausea, vomiting, lower abdominal pain which has been ongoing for approximate the past 5 days.  She now notes that she is having worsening pain in her back.  She was recently evaluated in urgent care and put on antibiotics for urinary tract infection as well as symptomatic treatment for her nausea and vomiting.  Patient notes that she has been compliant with all medications with worsening symptoms.  She denies any associated chest pain or shortness of breath.  There is no associated diarrhea.  She notes that her symptoms began after eating a grilled chicken sandwich and initially she thought this was food poisoning.   Abdominal Pain      Prior to Admission medications  Medication Sig Start Date End Date Taking? Authorizing Provider  fluconazole  (DIFLUCAN ) 150 MG tablet Take 1 now and 1 in 3 days 03/22/23   Signa Delon LABOR, NP  amoxicillin -clavulanate (AUGMENTIN ) 875-125 MG tablet Take 1 tablet by mouth every 12 (twelve) hours. 04/08/23   Leath-Warren, Etta PARAS, NP  ASHLYNA 0.15-0.03 &0.01 MG tablet Take 1 tablet by mouth once daily 03/20/24   Signa Delon A, NP  fluticasone  (FLONASE ) 50 MCG/ACT nasal spray Place 2 sprays into both nostrils daily. 04/08/23   Leath-Warren, Etta PARAS, NP  furosemide (LASIX) 40 MG tablet Take 40 mg by mouth daily. 10/21/23   [provider]  pseudoephedrine  (SUDAFED) 30 MG tablet Take 1 tablet (30 mg total) by mouth every 4 (four) hours as needed for congestion. 04/08/23   Leath-Warren, Etta PARAS, NP  UNABLE TO FIND Vaginal probiotic-BID    [provider]    Allergies: Orange oil    Review of Systems  Gastrointestinal:  Positive  for abdominal pain.  All other systems reviewed and are negative.   Updated Vital Signs BP (!) 141/95 (BP Location: Right Arm)   Pulse 79   Temp 98.6 F (37 C) (Oral)   Resp 20   Ht 5' 10 (1.778 m)   Wt 103.4 kg   LMP 03/29/2024 (Approximate)   SpO2 100%   BMI 32.71 kg/m   Physical Exam Vitals and nursing note reviewed.  Constitutional:      General: She is not in acute distress.    Appearance: Normal appearance. She is not ill-appearing.  HENT:     Head: Normocephalic and atraumatic.     Nose: Nose normal.     Mouth/Throat:     Mouth: Mucous membranes are moist.  Eyes:     Extraocular Movements: Extraocular movements intact.     Conjunctiva/sclera: Conjunctivae normal.     Pupils: Pupils are equal, round, and reactive to light.  Cardiovascular:     Rate and Rhythm: Normal rate and regular rhythm.     Pulses: Normal pulses.     Heart sounds: Normal heart sounds. No murmur heard.    No gallop.  Pulmonary:     Effort: Pulmonary effort is normal. No respiratory distress.     Breath sounds: Normal breath sounds. No stridor. No wheezing, rhonchi or rales.  Abdominal:     General: Abdomen is flat. Bowel sounds are normal. There is no distension.     Palpations: Abdomen is soft.  Tenderness: There is abdominal tenderness in the right lower quadrant, suprapubic area and left lower quadrant.  Musculoskeletal:        General: Normal range of motion.     Cervical back: Normal range of motion and neck supple.  Skin:    General: Skin is warm and dry.  Neurological:     General: No focal deficit present.     Mental Status: She is alert and oriented to person, place, and time. Mental status is at baseline.     Cranial Nerves: No cranial nerve deficit.     Motor: No weakness.  Psychiatric:        Mood and Affect: Mood normal.        Behavior: Behavior normal.        Thought Content: Thought content normal.        Judgment: Judgment normal.     (all labs ordered are  listed, but only abnormal results are displayed) Labs Reviewed  COMPREHENSIVE METABOLIC PANEL WITH GFR  LIPASE, BLOOD  CBC WITH DIFFERENTIAL/PLATELET  URINALYSIS, ROUTINE W REFLEX MICROSCOPIC  HCG, SERUM, QUALITATIVE    EKG: None  Radiology: No results found.   Procedures   Medications Ordered in the ED  sodium chloride  0.9 % bolus 1,000 mL (has no administration in time range)  metoCLOPramide  (REGLAN ) injection 10 mg (10 mg Intravenous Given 05/02/24 1654)  diphenhydrAMINE  (BENADRYL ) injection 25 mg (25 mg Intravenous Given 05/02/24 1654)  dicyclomine  (BENTYL ) injection 20 mg (20 mg Intramuscular Given 05/02/24 1653)                                    Medical Decision Making Amount and/or Complexity of Data Reviewed Labs: ordered. Radiology: ordered.  Risk Prescription drug management.   This patient presents to the ED for concern of abdominal pain, nausea, vomiting differential diagnosis includes acute appendicitis, cholecystitis, bowel obstruction, diverticulitis, ovarian torsion or cyst, PID, tubo-ovarian abscess, pyelonephritis, kidney stone, pancreatitis, mesenteric ischemia, gastroenteritis, colitis    Additional history obtained:  Additional history obtained from none External records from outside source obtained and reviewed including none   Lab Tests:  I Ordered, and personally interpreted labs.  The pertinent results include: No leukocytosis, no anemia, normal kidney function liver function, unremarkable electrolytes, urinalysis with large leukocytes and bacteria   Imaging Studies ordered:  I ordered imaging studies including CT scan abdomen and pelvis I independently visualized and interpreted imaging which showed no acute intra-abdominal surgical process I agree with the radiologist interpretation   Medicines ordered and prescription drug management:  I ordered medication including Rocephin , IV fluids, Reglan , Benadryl , Bentyl  for nausea,  vomiting, abdominal pain, urinary tract infection Reevaluation of the patient after these medicines showed that the patient improved I have reviewed the patients home medicines and have made adjustments as needed   Problem List / ED Course:  Patient is doing very well at this time and is stable for discharge home.  Symptoms have greatly improved with treatment in the emergency department.  She was given a dose of Rocephin  in the emergency department for her apparent urinary tract infection.  Urine culture has been sent as well.  Patient has no indication for pyelonephritis at this point.  Vital signs are stable with no indication for sepsis.  CT scan of the abdomen and pelvis demonstrated no acute surgical process.  Blood work is otherwise unremarkable.  Close follow-up with primary care doctor  was discussed as well as strict turn precautions for any new or worsening symptoms.  Patient voiced understanding to the plan and had no additional questions.  She is tolerating p.o. intake at this time without difficulty.   Social Determinants of Health:  None        Final diagnoses:  None    ED Discharge Orders     None          Daralene Lonni JONETTA DEVONNA 05/02/24 2027    Dean Clarity, MD 05/02/24 2254  "

## 2024-05-03 ENCOUNTER — Ambulatory Visit: Payer: MEDICAID | Admitting: Adult Health

## 2024-05-05 LAB — URINE CULTURE: Culture: >=100000 — AB

## 2024-05-06 ENCOUNTER — Telehealth (HOSPITAL_BASED_OUTPATIENT_CLINIC_OR_DEPARTMENT_OTHER): Payer: Self-pay | Admitting: *Deleted

## 2024-05-06 NOTE — Telephone Encounter (Signed)
 Post ED Visit - Positive Culture Follow-up: Unsuccessful Patient Follow-up  Culture assessed and recommendations reviewed by:  [x]  Dorn Poot,  Pharm.D. []  Venetia Gully, Pharm.D., BCPS AQ-ID []  Garrel Crews, Pharm.D., BCPS []  Almarie Lunger, Pharm.D., BCPS []  [employer], [address].D., BCPS, AAHIVP []  Rosaline Bihari, Pharm.D., BCPS, AAHIVP []  Massie Rigg, PharmD []  Jodie Rower, PharmD, BCPS  Positive urine culture  []  Patient discharged without antimicrobial prescription and treatment is now indicated [x]  Organism is resistant to prescribed ED discharge antimicrobial []  Patient with positive blood cultures  Plan: D/C Keflex  Give Macrobid 100mg  BID x 5 days per [name], [place]  Unable to contact patient , letter will be sent to address on file  Robin Powers 05/06/2024, 4:34 PM

## 2024-05-06 NOTE — Progress Notes (Signed)
 ED Antimicrobial Stewardship Positive Culture Follow Up   DAVION MEARA is an 19 y.o. female who presented to [facility] with a chief complaint of  Chief Complaint  Patient presents with   Abdominal Pain    Recent Results (from the past 720 hours)  Urine Culture     Status: Abnormal   Collection Time: 05/02/24  5:30 PM   Specimen: Urine, Clean Catch  Result Value Ref Range Status   Specimen Description   Final    URINE, CLEAN CATCH Performed at [facility], [address] [place], [place] [address]    Special Requests   Final    NONE Performed at [facility], [address], [place], [place] [address]    Culture >=100,000 COLONIES/mL STAPHYLOCOCCUS SAPROPHYTICUS (A)  Final   Report Status 05/05/2024 FINAL  Final   Organism ID, Bacteria STAPHYLOCOCCUS SAPROPHYTICUS (A)  Final      Susceptibility   Staphylococcus saprophyticus - MIC*    CIPROFLOXACIN <=0.5 SENSITIVE Sensitive     GENTAMICIN <=0.5 SENSITIVE Sensitive     NITROFURANTOIN <=16 SENSITIVE Sensitive     OXACILLIN 2 RESISTANT Resistant     TETRACYCLINE <=1 SENSITIVE Sensitive     VANCOMYCIN <=0.5 SENSITIVE Sensitive     TRIMETH /SULFA  <=10 SENSITIVE Sensitive     RIFAMPIN <=0.5 SENSITIVE Sensitive     Inducible Clindamycin  NEGATIVE Sensitive     * >=100,000 COLONIES/mL STAPHYLOCOCCUS SAPROPHYTICUS    Treated with Keflex , organism resistant to prescribed antimicrobial  New antibiotic prescription: Macrobid  ED Provider: [name], PA-C   Dorn Poot 05/06/2024, 10:17 AM Clinical Pharmacist Monday - Friday phone -  [phone] Saturday - Sunday phone - [phone]
